# Patient Record
Sex: Female | Born: 1960
Health system: Southern US, Community
[De-identification: ages and names within clinical notes are randomized; demographics above are authoritative.]

## PROBLEM LIST (undated history)

## (undated) DIAGNOSIS — R079 Chest pain, unspecified: Secondary | ICD-10-CM

## (undated) HISTORY — DX: Chest pain, unspecified: R07.9

---

## 1999-02-21 ENCOUNTER — Ambulatory Visit (HOSPITAL_COMMUNITY): Admission: RE | Admit: 1999-02-21 | Discharge: 1999-02-21 | Payer: Self-pay | Admitting: *Deleted

## 1999-02-21 ENCOUNTER — Encounter: Payer: Self-pay | Admitting: *Deleted

## 1999-03-28 ENCOUNTER — Encounter: Payer: Self-pay | Admitting: Obstetrics and Gynecology

## 1999-03-28 ENCOUNTER — Ambulatory Visit (HOSPITAL_COMMUNITY): Admission: RE | Admit: 1999-03-28 | Discharge: 1999-03-28 | Payer: Self-pay | Admitting: *Deleted

## 1999-06-21 ENCOUNTER — Inpatient Hospital Stay (HOSPITAL_COMMUNITY): Admission: AD | Admit: 1999-06-21 | Discharge: 1999-06-24 | Payer: Self-pay | Admitting: Obstetrics and Gynecology

## 1999-06-21 ENCOUNTER — Encounter (INDEPENDENT_AMBULATORY_CARE_PROVIDER_SITE_OTHER): Payer: Self-pay

## 1999-06-27 ENCOUNTER — Encounter: Admission: RE | Admit: 1999-06-27 | Discharge: 1999-09-25 | Payer: Self-pay | Admitting: Obstetrics and Gynecology

## 1999-10-25 ENCOUNTER — Encounter (HOSPITAL_COMMUNITY): Admission: RE | Admit: 1999-10-25 | Discharge: 2000-01-23 | Payer: Self-pay | Admitting: Obstetrics and Gynecology

## 2000-01-25 ENCOUNTER — Encounter: Admission: RE | Admit: 2000-01-25 | Discharge: 2000-02-19 | Payer: Self-pay | Admitting: Obstetrics and Gynecology

## 2000-08-20 ENCOUNTER — Other Ambulatory Visit: Admission: RE | Admit: 2000-08-20 | Discharge: 2000-08-20 | Payer: Self-pay | Admitting: Obstetrics and Gynecology

## 2001-04-08 ENCOUNTER — Encounter: Payer: Self-pay | Admitting: *Deleted

## 2001-04-08 ENCOUNTER — Ambulatory Visit (HOSPITAL_COMMUNITY): Admission: RE | Admit: 2001-04-08 | Discharge: 2001-04-08 | Payer: Self-pay | Admitting: *Deleted

## 2001-08-24 ENCOUNTER — Inpatient Hospital Stay (HOSPITAL_COMMUNITY): Admission: AD | Admit: 2001-08-24 | Discharge: 2001-08-27 | Payer: Self-pay | Admitting: Obstetrics and Gynecology

## 2001-10-08 ENCOUNTER — Other Ambulatory Visit: Admission: RE | Admit: 2001-10-08 | Discharge: 2001-10-08 | Payer: Self-pay | Admitting: Obstetrics and Gynecology

## 2002-10-29 ENCOUNTER — Other Ambulatory Visit: Admission: RE | Admit: 2002-10-29 | Discharge: 2002-10-29 | Payer: Self-pay | Admitting: Obstetrics and Gynecology

## 2003-11-04 ENCOUNTER — Other Ambulatory Visit: Admission: RE | Admit: 2003-11-04 | Discharge: 2003-11-04 | Payer: Self-pay | Admitting: Obstetrics and Gynecology

## 2005-08-20 ENCOUNTER — Other Ambulatory Visit: Admission: RE | Admit: 2005-08-20 | Discharge: 2005-08-20 | Payer: Self-pay | Admitting: Obstetrics and Gynecology

## 2007-09-10 ENCOUNTER — Encounter: Admission: RE | Admit: 2007-09-10 | Discharge: 2007-09-10 | Payer: Self-pay | Admitting: Obstetrics and Gynecology

## 2009-09-21 ENCOUNTER — Encounter: Payer: Self-pay | Admitting: Cardiovascular Disease

## 2010-09-26 ENCOUNTER — Encounter: Payer: Self-pay | Admitting: Cardiovascular Disease

## 2010-10-06 NOTE — Discharge Summary (Signed)
[facility]  Patient:    Gina Garrett, BRIEL Visit Number: [id number] MRN: [id number]          Service Type: OBS Location: 910A [id number] [id number] Attending Physician:  Oliver Pila Dictated by:   Alvino Chapel, M.D. Admit Date:  08/24/2001 Discharge Date: 08/27/2001                             Discharge Summary  DISCHARGE DIAGNOSES: 1. Term pregnancy at 37+ weeks, delivered. 2. Advanced maternal age. 3. Status post normal spontaneous vaginal delivery.  DISCHARGE MEDICATIONS: 1. Motrin 600 mg p.o. q.6h. p.r.n. 2. Percocet one or two tablets p.o. q.4h. p.r.n.  FOLLOWUP:  The patient is to follow up in six weeks for her routine postpartum exam.  HISTORY OF PRESENT ILLNESS:  The patient is a 50 year old, G3, P0-1-1-1, who was admitted at 37+ weeks with a complaint of rupture of membranes and minimal contractions.  Pregnancy was complicated by advanced maternal age, and the patient declined amniocentesis.  The patient had a history of premature rupture of membranes at 34 weeks last pregnancy, and went on to deliver preterm.  PRENATAL LABORATORY DATA:  O+, antibody negative, RPR nonreactive, rubella immune.  Hepatitis B surface antigen negative.  HIV declined.  GC and chlamydia negative.  GBS negative.  PAST OBSTETRICAL HISTORY: 1. In 2001, she had a normal spontaneous vaginal delivery of a 5 pound 2 ounce    infant at 34 weeks. 2. In 1999, she had a spontaneous miscarriage.  PAST MEDICAL HISTORY:  None.  PAST GYNECOLOGICAL HISTORY:  None.  PAST SURGICAL HISTORY:  None.  PHYSICAL EXAMINATION:  VITAL SIGNS:  The patient was afebrile with stable vital signs.  Fetal heart rate was reactive.  PELVIC:  Fundal height was approximately 39 cm.  Estimated fetal weight was about 7 pounds.  Cervix was 70, 1, -2.  HOSPITAL COURSE:  Though the patient had grossly rupture membranes, she was having minimal contractions which were mild in nature,  and therefore was admitted with an order for Pitocin.  There was a delay of several hours in getting the patient admitted to labor and delivery, and upon reaching labor and delivery she was contracting every 3 minutes, and requesting pain medication.  She was then given Stadol several times for relief, and began to progress rather slowly throughout the evening and night.  Approximately 8 hours into the process the patient was very uncomfortable and requested an epidural.  At that point, she was completely effaced, 6+ cm dilated, and 0 to +1 station, and was making slow progress with dilation.  Each time Pitocin had been attempted, the patient had tetanic contractions with fetal deceleration. For that reason, after her epidural was placed, an IUPC was placed, and an amnio-infusion begun for some repetitive variable decelerations that was noted with the contractions.  With the IUCP in place it was noted that the patient was not adequate, and therefore she was restarted on Pitocin.  At this time the infant tolerated this well.  Eventually, the patient was adequate and despite some continued variable decelerations, overall the tracing was reassuring with accelerations and good scalp stimulation.  Several hours later the patient began to get uncomfortable, and despite multiple efforts of redosing her epidural she continued to be uncomfortable.  At that point, she was 9 cm, completely effaced, and a 0 station.  However, this was unreducible, and therefore the patient requested her epidural  be restarted.  This was replaced, and the patient was quite comfortable at that point.  She then reached complete dilation and pushed well with a normal spontaneous vaginal delivery of a viable female infant over an intact perineum.  There was a nuchal cord x1.  Weight was 6 pounds 14 ounces.  Apgars were 5 and 9. Placenta delivered spontaneously.  There was a mild shoulder dystocia which was relieved with  McRoberts maneuver.  Cervix, rectum, and vagina were intact. Estimated blood loss was 350 cc.  The patient was given a dose of ampicillin intrapartum, given a low grade temperature of 99 to 100, and given her prolonged ruptured status.  On postpartum day #2, the patient was afebrile, vital signs were stable.  She was felt stable for discharge home, and was discharged as stated previously with medications and followup in six weeks. Dictated by:   Alvino Chapel, M.D. Attending Physician:  Oliver Pila DD:  08/27/01 TD:  08/27/01 Job: [id number]

## 2010-10-06 NOTE — Discharge Summary (Signed)
[facility]  Patient:    Gina Garrett                        MRN: [id number] Adm. Date:  [id number] Disc. Date: [id number] Attending:  Michaele Offer                           Discharge Summary  DISCHARGE DIAGNOSES:          1. Intrauterine pregnancy at 34+ weeks, delivered.                               2. Premature rupture of membranes.                               3. Preterm labor.                               4. Advanced maternal age.                               5. Status post vacuum-assisted vaginal delivery.  DISCHARGE MEDICATIONS:        1. Percocet 1-2 tablets p.o. every four to six hours                                  as needed.                               2. Tylenol No. 3 1-2 tablets p.o. every four hours                                  p.r.n.                               3. Prenatal vitamins 1 p.o. daily.  FOLLOW-UP:                    The patient is to follow up in our office in approximately six weeks for her postpartum exam.  HOSPITAL COURSE:              The patient is a 50 year old G2, P0-0-1-0, who is  admitted at 34+ weeks with complaint of spontaneous rupture of membranes at approximately 1830 on June 21, 1999.  The patient had been having some contractions throughout the day, which increased after the rupture of membranes. She was noted to have good fetal movement and no vaginal bleeding.  Her prenatal care had been complicated by advanced maternal age, with a normal ultrasound. he patient declined amniocentesis.  PRENATAL LABORATORY DATA:     O-positive, antibody negative, rubella immune. Hepatitis B surface antigen negative.  HIV negative.  GC negative, chlamydia negative.  RPR negative.  PAST OBSTETRICAL HISTORY:     The patient had a miscarriage in February 1999.  PAST MEDICAL HISTORY:         None.  PAST SURGICAL HISTORY:        None.  MEDICATIONS:                  None.  ALLERGIES:                     No known drug allergies.  SOCIAL HISTORY:               The patient is married, and has no abuse of tobacco, drugs, or alcohol.  PHYSICAL EXAMINATION:         On admission the patient is afebrile with stable vital signs.  Fetal heart rate was reactive.  She had contractions every three o five minutes.  She was gravid, nontender.  Estimated fetal weight was approximately 5 pounds.  She was grossly ruptured with positive nitrazine, and was approximately 3 cm, 80% effaced, and -1 station.  HOSPITAL COURSE:              The patient was admitted with expected management. She continued to progress.  Had some Pitocin augmentation later in labor. Reached complete dilation, and pushed for about two hours, with the vertex brought to a +2 station and transverse lie.  As the patient had no epidural, she was given a pudendal and local block, and had a vacuum-assisted vaginal delivery of a viable female infant, Apgars were 8 and 9, weight was 5 pounds 2 ounces, and she had an intact perineum.  The placenta delivered spontaneously.  Dr. Alison Murray was in attendance from pediatrics to observe the baby.  Estimated blood loss was less han 500 cc.  The patient was then admitted for routine postpartum care, and did well. On postpartum day #2, she was afebrile with stable vital signs.  She had normal  lochia and no complaints.  Therefore, she was discharged to home with medications and follow-up as previously stated.            DD:  06/24/99 TD:  06/25/99 Job: [id number]

## 2010-10-06 NOTE — Op Note (Signed)
[facility]  Patient:    Gina Garrett                        MRN: [id number] Proc. Date: 06/22/99 Adm. Date:  [id number] Attending:  Michaele Offer                           Operative Report  DESCRIPTION OF PROCEDURE:  Patient reached completely dilated and pushed for approximately two hours without an epidural.  During this time the fetal heart tracing remained reassuring with some mild to moderate variable decelerations. She brought the vertex to a +2 station in a transverse position.  Due to the prolonged second stage, assisted delivery was discussed with the patient and her husband nd we all agreed to proceed.  A pudendal block was then administered with 1% lidocaine.  A local block was also performed at the perineum with 1% lidocaine.  Her bladder was then drained with a red rubber catheter of 100 cc of clear urine. As I did not feel the baby was very accessible to forceps, I applied the Mityvac vacuum cup with good application.  With three contractions, the vertex was brought easily to the perineum.  The NICU team was called and was present at the time of delivery with Dr. Alison Murray attending.  She delivered a viable female infant with Apgars of 8 and 9 that weighed 5 pounds, 2 ounces over an intact perineum.  There was  body cord which the baby delivered through.  The cord was then doubly clamped and cut and the baby taken to the warmer for the neonatal team.  The placenta then delivered spontaneously, was intact and there was a 3-vessel cord.  She had a second degree vaginal laceration just to the left of midline which was repaired  with 3-0 Vicryl with local block.  Her cervix and rectum were intact. Estimated blood loss was less than 500 cc.  Mother and baby were recovering well at the end of the procedure. DD:  06/22/99 TD:  06/23/99 Job: [id number]

## 2012-05-26 ENCOUNTER — Emergency Department (HOSPITAL_COMMUNITY)
Admission: EM | Admit: 2012-05-26 | Discharge: 2012-05-26 | Disposition: A | Discharge status: Home / Self Care | Payer: BC Managed Care – PPO | Attending: Emergency Medicine | Admitting: Emergency Medicine

## 2012-05-26 ENCOUNTER — Emergency Department (HOSPITAL_COMMUNITY): Payer: BC Managed Care – PPO

## 2012-05-26 ENCOUNTER — Encounter (HOSPITAL_COMMUNITY): Payer: Self-pay | Admitting: Emergency Medicine

## 2012-05-26 DIAGNOSIS — IMO0002 Reserved for concepts with insufficient information to code with codable children: Secondary | ICD-10-CM | POA: Insufficient documentation

## 2012-05-26 DIAGNOSIS — S39012A Strain of muscle, fascia and tendon of lower back, initial encounter: Secondary | ICD-10-CM

## 2012-05-26 DIAGNOSIS — X58XXXA Exposure to other specified factors, initial encounter: Secondary | ICD-10-CM | POA: Insufficient documentation

## 2012-05-26 DIAGNOSIS — R209 Unspecified disturbances of skin sensation: Secondary | ICD-10-CM | POA: Insufficient documentation

## 2012-05-26 DIAGNOSIS — Y929 Unspecified place or not applicable: Secondary | ICD-10-CM | POA: Insufficient documentation

## 2012-05-26 DIAGNOSIS — R61 Generalized hyperhidrosis: Secondary | ICD-10-CM | POA: Insufficient documentation

## 2012-05-26 DIAGNOSIS — Z7982 Long term (current) use of aspirin: Secondary | ICD-10-CM | POA: Insufficient documentation

## 2012-05-26 DIAGNOSIS — Y939 Activity, unspecified: Secondary | ICD-10-CM | POA: Insufficient documentation

## 2012-05-26 LAB — BASIC METABOLIC PANEL
Calcium: 9.5 mg/dL (ref 8.4–10.5)
Creatinine, Ser: 0.7 mg/dL (ref 0.50–1.10)
GFR calc Af Amer: >90 mL/min (ref >90)
Sodium: 143 mEq/L (ref 135–145)

## 2012-05-26 LAB — POCT I-STAT TROPONIN I: Troponin i, poc: 0 ng/mL (ref 0.00–0.08)

## 2012-05-26 LAB — CBC
MCH: 29.7 pg (ref 26.0–34.0)
MCV: 90.3 fL (ref 78.0–100.0)
Platelets: 203 10*3/uL (ref 150–400)
RBC: 4.34 MIL/uL (ref 3.87–5.11)
RDW: 13.2 % (ref 11.5–15.5)
WBC: 4.1 10*3/uL (ref 4.0–10.5)

## 2012-05-26 MED ORDER — IBUPROFEN 800 MG PO TABS
800.0000 mg | ORAL_TABLET | Freq: Once | ORAL | Status: AC
  Administered 2012-05-26: 800 mg via ORAL
  Filled 2012-05-26: qty 1

## 2012-05-26 NOTE — ED Notes (Signed)
Pt c/o upper left sided back pain with pain and numbness down left arm x 2 days

## 2012-05-26 NOTE — ED Provider Notes (Signed)
Medical screening examination/treatment/procedure(s) were performed by non-physician practitioner and as supervising physician I was immediately available for consultation/collaboration.   Jermaine Neuharth L Saudia Smyser, MD 05/26/12 1758 

## 2012-05-26 NOTE — ED Provider Notes (Signed)
History     CSN: [id number]  Arrival date & time 05/26/12  0917   First MD Initiated Contact with Patient 05/26/12 1015      Chief Complaint  Patient presents with  . Back Pain  . Numbness    (Consider location/radiation/quality/duration/timing/severity/associated sxs/prior treatment) HPI Comments: 52 year old female with no significant past medical history presents to the emergency department complaining of left-sided back pain located near her scapula beginning yesterday. Pain yesterday was worse than today. She took 81 mg aspirin last night which may have relieved her pain. Describes the pain as a dull ache, radiating down her left arm with associated numbness and tingling currently rated 3/10. Denies heavy lifting or any physical activity out of the normal. When the back pain started admits to associated diaphoresis with it she'll. Denies nausea, vomiting, chest pain, shortness of breath, lightheadedness or dizziness. No family history of heart disease before the age of 69, however her mom did have a heart attack in her 29s. Patient is a nonsmoker.  Patient is a 52 y.o. female presenting with back pain. The history is provided by the patient.  Back Pain  Associated symptoms include numbness. Pertinent negatives include no chest pain and no weakness.    History reviewed. No pertinent past medical history.  History reviewed. No pertinent past surgical history.  History reviewed. No pertinent family history.  History  Substance Use Topics  . Smoking status: Never Smoker   . Smokeless tobacco: Not on file  . Alcohol Use: Yes     Comment: occ    OB History    Grav Para Term Preterm Abortions TAB SAB Ect Mult Living                  Review of Systems  Constitutional: Positive for chills and diaphoresis.  HENT: Negative for neck pain and neck stiffness.   Eyes: Negative for visual disturbance.  Respiratory: Negative for cough and shortness of breath.   Cardiovascular:  Negative for chest pain.  Gastrointestinal: Negative for nausea and vomiting.  Genitourinary: Negative.   Musculoskeletal: Positive for back pain.  Skin: Negative for pallor.  Neurological: Positive for numbness. Negative for weakness.  Psychiatric/Behavioral: Negative for confusion. The patient is not nervous/anxious.     Allergies  Review of patient's allergies indicates no known allergies.  Home Medications   Current Outpatient Rx  Name  Route  Sig  Dispense  Refill  . ASPIRIN EC 81 MG PO TBEC   Oral   Take 81 mg by mouth daily. For not feeling well.         Marland Kitchen ONE-DAILY MULTI VITAMINS PO TABS   Oral   Take 1 tablet by mouth daily.           BP 133/86  Pulse 75  Temp 98.3 F (36.8 C) (Oral)  Resp 18  SpO2 99%  Physical Exam  Nursing note and vitals reviewed. Constitutional: She is oriented to person, place, and time. Vital signs are normal. She appears well-developed and well-nourished. No distress.  HENT:  Head: Normocephalic and atraumatic.  Mouth/Throat: Oropharynx is clear and moist.  Eyes: Conjunctivae normal and EOM are normal. Pupils are equal, round, and reactive to light.  Neck: Normal range of motion. Neck supple.  Cardiovascular: Normal rate, regular rhythm, normal heart sounds and intact distal pulses.        No extremity edema.  Pulmonary/Chest: Effort normal and breath sounds normal.  Abdominal: Soft. Bowel sounds are normal. There is no  tenderness.  Musculoskeletal: Normal range of motion. She exhibits no edema.       Left shoulder: Normal.       Thoracic back: She exhibits tenderness. She exhibits normal range of motion and no bony tenderness.       Back:       Neck or shoulder movement does not exacerbate her pain.  Neurological: She is alert and oriented to person, place, and time.  Skin: Skin is warm and dry.  Psychiatric: She has a normal mood and affect. Her behavior is normal.    ED Course  Procedures (including critical care  time)   Labs Reviewed  CBC  BASIC METABOLIC PANEL  POCT I-STAT TROPONIN I   Date: 05/26/2012  Rate: 59  Rhythm: normal sinus rhythm  QRS Axis: normal  Intervals: normal  ST/T Wave abnormalities: normal  Conduction Disutrbances:none  Narrative Interpretation: normal EKG  Old EKG Reviewed: none available   Dg Chest 2 View  05/26/2012  *RADIOLOGY REPORT*  Clinical Data: Shortness of breath.  Left-sided arm pain and numbness.  CHEST - 2 VIEW  Comparison: No priors.  Findings: Lung volumes are normal.  No consolidative airspace disease.  No pleural effusions.  No pneumothorax.  No pulmonary nodule or mass noted.  Pulmonary vasculature and the cardiomediastinal silhouette are within normal limits.  Bilateral nipple shadows.  IMPRESSION: 1. No radiographic evidence of acute cardiopulmonary disease.   Original Report Authenticated By: Trudie Reed, M.D.      1. Back strain       MDM  52 y/o female with back pain radiating into her left arm. Tenderness near scapular area present. No focal neuro deficits on exam. Cardiac workup completed. EKG, CXR, labs unremarkable. I do not feel her back pain is cardiac related. Tenderness of scapular region gone after receiving ibuprofen. She is stable for discharge. She will f/u with her PCP later this week. Patient states understanding of plan and is agreeable. Return precautions discussed.        Trevor Mace, PA-C 05/26/12 1128

## 2013-10-20 ENCOUNTER — Encounter: Payer: Self-pay | Admitting: Cardiovascular Disease

## 2013-10-20 LAB — IFOBT (OCCULT BLOOD): IFOBT: NEGATIVE

## 2014-07-20 ENCOUNTER — Emergency Department (HOSPITAL_COMMUNITY)
Admission: EM | Admit: 2014-07-20 | Discharge: 2014-07-20 | Disposition: A | Discharge status: Home / Self Care | Payer: 59 | Source: Home / Self Care | Attending: Family Medicine | Admitting: Family Medicine

## 2014-07-20 ENCOUNTER — Encounter (HOSPITAL_COMMUNITY): Payer: Self-pay | Admitting: Emergency Medicine

## 2014-07-20 DIAGNOSIS — R42 Dizziness and giddiness: Secondary | ICD-10-CM

## 2014-07-20 DIAGNOSIS — H6502 Acute serous otitis media, left ear: Secondary | ICD-10-CM | POA: Diagnosis not present

## 2014-07-20 LAB — POCT URINALYSIS DIP (DEVICE)
BILIRUBIN URINE: NEGATIVE
GLUCOSE, UA: NEGATIVE mg/dL
KETONES UR: NEGATIVE mg/dL
NITRITE: NEGATIVE
Protein, ur: NEGATIVE mg/dL
Specific Gravity, Urine: 1.015 (ref 1.005–1.030)
Urobilinogen, UA: 0.2 mg/dL (ref 0.0–1.0)
pH: 7.5 (ref 5.0–8.0)

## 2014-07-20 LAB — POCT I-STAT, CHEM 8
BUN: 18 mg/dL (ref 6–23)
CHLORIDE: 104 mmol/L (ref 96–112)
CREATININE: 0.7 mg/dL (ref 0.50–1.10)
Calcium, Ion: 1.17 mmol/L (ref 1.12–1.23)
GLUCOSE: 97 mg/dL (ref 70–99)
HEMATOCRIT: 42 % (ref 36.0–46.0)
Hemoglobin: 14.3 g/dL (ref 12.0–15.0)
POTASSIUM: 3.9 mmol/L (ref 3.5–5.1)
Sodium: 141 mmol/L (ref 135–145)
TCO2: 23 mmol/L (ref 0–100)

## 2014-07-20 MED ORDER — FLUTICASONE PROPIONATE 50 MCG/ACT NA SUSP: 2.0000 | Freq: Two times a day (BID) | NASAL | Status: DC

## 2014-07-20 MED ORDER — PREDNISONE 10 MG PO TABS: ORAL_TABLET | ORAL | Status: DC

## 2014-07-20 MED ORDER — MECLIZINE HCL 25 MG PO TABS: 25.0000 mg | ORAL_TABLET | Freq: Four times a day (QID) | ORAL | Status: DC | PRN

## 2014-07-20 NOTE — ED Provider Notes (Signed)
CSN: [id number]     Arrival date & time 07/20/14  1127 History   First MD Initiated Contact with Patient 07/20/14 1316     Chief Complaint  Patient presents with  . Otalgia  . Nausea   (Consider location/radiation/quality/duration/timing/severity/associated sxs/prior Treatment) HPI       54 year old female presents complaining of left ear pain, dizziness, chills, nausea. This started Saturday and has gotten worse over the past 2 days. No vomiting, fever, diarrhea, sinus pain or pressure, or cough. She has not been able to eat or drink much because of this she feels that she is dehydrated.  History reviewed. No pertinent past medical history. History reviewed. No pertinent past surgical history. History reviewed. No pertinent family history. History  Substance Use Topics  . Smoking status: Never Smoker   . Smokeless tobacco: Not on file  . Alcohol Use: Yes     Comment: occ   OB History    No data available     Review of Systems  Constitutional: Positive for chills. Negative for fever.  HENT: Positive for ear pain. Negative for postnasal drip and sore throat.   Respiratory: Negative for cough.   Neurological: Positive for dizziness.  All other systems reviewed and are negative.   Allergies  Review of patient's allergies indicates no known allergies.  Home Medications   Prior to Admission medications   Medication Sig Start Date End Date Taking? Authorizing Provider  aspirin EC 81 MG tablet Take 81 mg by mouth daily. For not feeling well.   Yes Historical Provider, MD  fluticasone (FLONASE) 50 MCG/ACT nasal spray Place 2 sprays into both nostrils 2 (two) times daily. Decrease to 2 sprays/nostril daily after 5 days 07/20/14   Graylon GoodZachary H Riot Waterworth, PA-C  meclizine (ANTIVERT) 25 MG tablet Take 1-2 tablets (25-50 mg total) by mouth every 6 (six) hours as needed for dizziness. 07/20/14   Graylon GoodZachary H Torrie Namba, PA-C  Multiple Vitamin (MULTIVITAMIN) tablet Take 1 tablet by mouth daily.    Historical  Provider, MD  predniSONE (DELTASONE) 10 MG tablet 4 tabs PO QD for 4 days; 3 tabs PO QD for 3 days; 2 tabs PO QD for 2 days; 1 tab PO QD for 1 day 07/20/14   Graylon GoodZachary H Ahnesti Townsend, PA-C   BP 134/72 mmHg  Pulse 60  Temp(Src) 98.4 F (36.9 C) (Oral)  Resp 16  SpO2 100% Physical Exam  Constitutional: She is oriented to person, place, and time. Vital signs are normal. She appears well-developed and well-nourished. No distress.  HENT:  Head: Normocephalic and atraumatic.  Right Ear: Tympanic membrane, external ear and ear canal normal.  Left Ear: External ear and ear canal normal. Tympanic membrane is bulging (tympanic membrane is bulging with a serous effusion and air-fluid levels).  Nose: Right sinus exhibits no maxillary sinus tenderness and no frontal sinus tenderness. Left sinus exhibits no maxillary sinus tenderness and no frontal sinus tenderness.  Mouth/Throat: Oropharynx is clear and moist.  Cardiovascular: Normal rate, regular rhythm and normal heart sounds.   Pulmonary/Chest: Effort normal and breath sounds normal. No respiratory distress.  Neurological: She is alert and oriented to person, place, and time. She has normal strength. Coordination normal.  Skin: Skin is warm and dry. No rash noted. She is not diaphoretic.  Psychiatric: She has a normal mood and affect. Judgment normal.  Nursing note and vitals reviewed.   ED Course  Procedures (including critical care time) Labs Review Labs Reviewed  POCT URINALYSIS DIP (DEVICE) - Abnormal; Notable  for the following:    Hgb urine dipstick TRACE (*)    Leukocytes, UA SMALL (*)    All other components within normal limits  POCT I-STAT, CHEM 8    Imaging Review No results found.   MDM   1. Acute serous otitis media of left ear without rupture   2. Vertigo    Treat with meclizine, Flonase, prednisone. Follow-up when necessary if worsening. No red flags at this time.   Meds ordered this encounter  Medications  . meclizine  (ANTIVERT) 25 MG tablet    Sig: Take 1-2 tablets (25-50 mg total) by mouth every 6 (six) hours as needed for dizziness.    Dispense:  20 tablet    Refill:  0  . predniSONE (DELTASONE) 10 MG tablet    Sig: 4 tabs PO QD for 4 days; 3 tabs PO QD for 3 days; 2 tabs PO QD for 2 days; 1 tab PO QD for 1 day    Dispense:  30 tablet    Refill:  0  . fluticasone (FLONASE) 50 MCG/ACT nasal spray    Sig: Place 2 sprays into both nostrils 2 (two) times daily. Decrease to 2 sprays/nostril daily after 5 days    Dispense:  16 g    Refill:  2       Graylon Good, PA-C 07/20/14 1356

## 2014-07-20 NOTE — ED Notes (Signed)
C/o  Left ear pain.  Neck soreness.  Dizzy.  Chills.  Nausea comes and goes.  Loose stools.  Symptoms present since Saturday.  No relief with rest or aspirin.

## 2014-07-20 NOTE — Discharge Instructions (Signed)
Benign Positional Vertigo °Vertigo means you feel like you or your surroundings are moving when they are not. Benign positional vertigo is the most common form of vertigo. Benign means that the cause of your condition is not serious. Benign positional vertigo is more common in older adults. °CAUSES  °Benign positional vertigo is the result of an upset in the labyrinth system. This is an area in the middle ear that helps control your balance. This may be caused by a viral infection, head injury, or repetitive motion. However, often no specific cause is found. °SYMPTOMS  °Symptoms of benign positional vertigo occur when you move your head or eyes in different directions. Some of the symptoms may include: °· Loss of balance and falls. °· Vomiting. °· Blurred vision. °· Dizziness. °· Nausea. °· Involuntary eye movements (nystagmus). °DIAGNOSIS  °Benign positional vertigo is usually diagnosed by physical exam. If the specific cause of your benign positional vertigo is unknown, your caregiver may perform imaging tests, such as magnetic resonance imaging (MRI) or computed tomography (CT). °TREATMENT  °Your caregiver may recommend movements or procedures to correct the benign positional vertigo. Medicines such as meclizine, benzodiazepines, and medicines for nausea may be used to treat your symptoms. In rare cases, if your symptoms are caused by certain conditions that affect the inner ear, you may need surgery. °HOME CARE INSTRUCTIONS  °· Follow your caregiver's instructions. °· Move slowly. Do not make sudden body or head movements. °· Avoid driving. °· Avoid operating heavy machinery. °· Avoid performing any tasks that would be dangerous to you or others during a vertigo episode. °· Drink enough fluids to keep your urine clear or pale yellow. °SEEK IMMEDIATE MEDICAL CARE IF:  °· You develop problems with walking, weakness, numbness, or using your arms, hands, or legs. °· You have difficulty speaking. °· You develop  severe headaches. °· Your nausea or vomiting continues or gets worse. °· You develop visual changes. °· Your family or friends notice any behavioral changes. °· Your condition gets worse. °· You have a fever. °· You develop a stiff neck or sensitivity to light. °MAKE SURE YOU:  °· Understand these instructions. °· Will watch your condition. °· Will get help right away if you are not doing well or get worse. °Document Released: 02/12/2006 Document Revised: 07/30/2011 Document Reviewed: 01/25/2011 °ExitCare® Patient Information ©2015 [employer]. This information is not intended to replace advice given to you by your health care provider. Make sure you discuss any questions you have with your health care provider. °Serous Otitis Media °Serous otitis media is fluid in the middle ear space. This space contains the bones for hearing and air. Air in the middle ear space helps to transmit sound.  °The air gets there through the eustachian tube. This tube goes from the back of the nose (nasopharynx) to the middle ear space. It keeps the pressure in the middle ear the same as the outside world. It also helps to drain fluid from the middle ear space. °CAUSES  °Serous otitis media occurs when the eustachian tube gets blocked. Blockage can come from: °· Ear infections. °· Colds and other upper respiratory infections. °· Allergies. °· Irritants such as cigarette smoke. °· Sudden changes in air pressure (such as descending in an airplane). °· Enlarged adenoids. °· A mass in the nasopharynx. °During colds and upper respiratory infections, the middle ear space can become temporarily filled with fluid. This can happen after an ear infection also. Once the infection clears, the fluid will   generally drain out of the ear through the eustachian tube. If it does not, then serous otitis media occurs. °SIGNS AND SYMPTOMS  °· Hearing loss. °· A feeling of fullness in the ear, without pain. °· Young children may not show any symptoms but  may show slight behavioral changes, such as agitation, ear pulling, or crying. °DIAGNOSIS  °Serous otitis media is diagnosed by an ear exam. Tests may be done to check on the movement of the eardrum. Hearing exams may also be done. °TREATMENT  °The fluid most often goes away without treatment. If allergy is the cause, allergy treatment may be helpful. Fluid that persists for several months may require minor surgery. A small tube is placed in the eardrum to: °· Drain the fluid. °· Restore the air in the middle ear space. °In certain situations, antibiotic medicines are used to avoid surgery. Surgery may be done to remove enlarged adenoids (if this is the cause). °HOME CARE INSTRUCTIONS  °· Keep children away from tobacco smoke. °· Keep all follow-up visits as directed by your health care provider. °SEEK MEDICAL CARE IF:  °· Your hearing is not better in 3 months. °· Your hearing is worse. °· You have ear pain. °· You have drainage from the ear. °· You have dizziness. °· You have serous otitis media only in one ear or have any bleeding from your nose (epistaxis). °· You notice a lump on your neck. °MAKE SURE YOU: °· Understand these instructions.   °· Will watch your condition.   °· Will get help right away if you are not doing well or get worse.   °Document Released: 07/28/2003 Document Revised: 09/21/2013 Document Reviewed: 12/02/2012 °ExitCare® Patient Information ©2015 [employer]. This information is not intended to replace advice given to you by your health care provider. Make sure you discuss any questions you have with your health care provider. ° °

## 2014-11-09 ENCOUNTER — Encounter: Payer: Self-pay | Admitting: Cardiovascular Disease

## 2015-09-08 ENCOUNTER — Ambulatory Visit (INDEPENDENT_AMBULATORY_CARE_PROVIDER_SITE_OTHER): Payer: 59 | Admitting: Cardiovascular Disease

## 2015-09-08 ENCOUNTER — Encounter: Payer: Self-pay | Admitting: Cardiovascular Disease

## 2015-09-08 VITALS — BP 104/68 | HR 60 | Ht 63.0 in | Wt 135.8 lb

## 2015-09-08 DIAGNOSIS — R079 Chest pain, unspecified: Secondary | ICD-10-CM

## 2015-09-08 DIAGNOSIS — R0789 Other chest pain: Secondary | ICD-10-CM | POA: Diagnosis not present

## 2015-09-08 NOTE — Progress Notes (Signed)
Cardiology Office Note   Date:  09/08/2015   ID:  Gina AskewKelley A Tamez, DOB 1960-11-21, MRN [id number]  PCP:  Lupe Carneyean Mitchell, MD  Cardiologist:   Elease HashimotoNahser, Deloris PingPhilip J, MD   Chief Complaint  Patient presents with  . Chest Pain   Problem List 1. Chest pain    History of Present Illness: Gina Garrett is a 55 y.o. female who presents for chest pain  Across her chest  Left sided, above her breast , radiates down her arm At rest, not necessarily with exercise Last for a few minutes.  Occurs several times a week.  Not pleuritic.   Not associated with eating or drinking  Exercises / walks 3 times week for 30 minutes Able to to do this without chest pain  She has no past medical problems   No past medical history on file.  No past surgical history on file.   Current Outpatient Prescriptions  Medication Sig Dispense Refill  . Magnesium 200 MG TABS Take 1 tablet by mouth daily.    . Multiple Vitamins-Minerals (MULTIVITAMIN WITH MINERALS) tablet Take 1 tablet by mouth daily.    . Omega 3 1200 MG CAPS Take 1 capsule by mouth daily.    . Potassium 99 MG TABS Take 1 tablet by mouth daily.     No current facility-administered medications for this visit.    Allergies:   Review of patient's allergies indicates no known allergies.    Social History:  The patient  reports that she has never smoked. She does not have any smokeless tobacco history on file. She reports that she drinks alcohol. She reports that she does not use illicit drugs.   Family History:  The patient's family history includes Breast cancer in her mother; Diabetes in her mother; Heart attack (age of onset: 2362) in her mother.    ROS:  Please see the history of present illness.    Review of Systems: Constitutional:  denies fever, chills, diaphoresis, appetite change and fatigue.  Gets cold easily   HEENT: denies photophobia, eye pain, redness, hearing loss, ear pain, congestion, sore throat, rhinorrhea, sneezing, neck  pain, neck stiffness and tinnitus.  Respiratory: denies SOB, DOE, cough, chest tightness, and wheezing.  Cardiovascular: denies chest pain, palpitations and leg swelling.  Gastrointestinal: denies nausea, vomiting, abdominal pain, diarrhea, constipation, blood in stool.  Genitourinary: denies dysuria, urgency, frequency, hematuria, flank pain and difficulty urinating.  Musculoskeletal: denies  myalgias, back pain, joint swelling, arthralgias and gait problem.   Skin: denies pallor, rash and wound.  Neurological: denies dizziness, seizures, syncope, weakness, light-headedness, numbness and headaches.   Hematological: denies adenopathy, easy bruising, personal or family bleeding history.  Psychiatric/ Behavioral: denies suicidal ideation, mood changes, confusion, nervousness, sleep disturbance and agitation.       All other systems are reviewed and negative.    PHYSICAL EXAM: VS:  BP 104/68 mmHg  Pulse 60  Ht 5\' 3"  (1.6 m)  Wt 135 lb 12.8 oz (61.598 kg)  BMI 24.06 kg/m2 , BMI Body mass index is 24.06 kg/(m^2). GEN: Well nourished, well developed, in no acute distress HEENT: normal Neck: no JVD, carotid bruits, or masses Cardiac: RRR; no murmurs, rubs, or gallops,no edema  Respiratory:  clear to auscultation bilaterally, normal work of breathing GI: soft, nontender, nondistended, + BS MS: no deformity or atrophy Skin: warm and dry, no rash Neuro:  Strength and sensation are intact Psych: normal   EKG:  EKG is ordered today. The ekg ordered today  demonstrates  NSR at 60.  Normal ECG    Recent Labs: No results found for requested labs within last 365 days.    Lipid Panel No results found for: CHOL, TRIG, HDL, CHOLHDL, VLDL, LDLCALC, LDLDIRECT    Wt Readings from Last 3 Encounters:  09/08/15 135 lb 12.8 oz (61.598 kg)      Other studies Reviewed: Additional studies/ records that were reviewed today include: . Review of the above records demonstrates:   ASSESSMENT  AND PLAN:  1.  Chest pain:   I have reviewed records from Dr. Diamantina [facility] office. The CP is very atypical , lipids are well controlled Her only real risk factor is that her mother had an MI at age 13. ( mother is obese)  She's been having some gastroesophageal reflux symptoms recently. I've advised her to try taking some omeprazole. I suspect that her chest pain is likely either musculoskeletal or gastrointestinal. I've also advised her to take some Motrin. I'll see her in 3 months. She'll let us know which one seemed to work better. She'll call me back sooner if her symptoms worsen and she wants to have a stress test.  She informed that she will he does not like taking medications. She'll take sodium bicarbonate and water instead of omeprazole and will use Tumeric  and other anti-inflammatories in place of Motrin. I think these should work well.   Current medicines are reviewed at length with the patient today.  The patient does not have concerns regarding medicines.  The following changes have been made:  no change  Labs/ tests ordered today include:  No orders of the defined types were placed in this encounter.     Disposition:   FU with  Me in 3 months      Ciji Boston, Deloris Ping, MD  09/08/2015 8:56 AM    [facility] [address] [place], [place], [place]  [address] Phone: [phone]; Fax: [phone]   [facility]  [address] [place], [place]  [address] [phone]   Fax [phone]

## 2015-09-08 NOTE — Patient Instructions (Addendum)
Medication Instructions:  Your physician recommends you continue current medications and does not recommend that you start any new medications.    Labwork: None Ordered   Testing/Procedures: None Ordered   Follow-Up: Your physician recommends that you schedule a follow-up appointment in: 3 months with Dr. Elease HashimotoNahser.    If you need a refill on your cardiac medications before your next appointment, please call your pharmacy.   Thank you for choosing [facility]! Eligha BridegroomMichelle Zubin Pontillo, RN [phone]

## 2015-11-09 ENCOUNTER — Encounter: Payer: Self-pay | Admitting: Cardiovascular Disease

## 2015-12-08 ENCOUNTER — Encounter: Payer: Self-pay | Admitting: Cardiovascular Disease

## 2015-12-08 ENCOUNTER — Ambulatory Visit (INDEPENDENT_AMBULATORY_CARE_PROVIDER_SITE_OTHER): Payer: 59 | Admitting: Cardiovascular Disease

## 2015-12-08 VITALS — BP 98/62 | HR 64 | Ht 63.0 in | Wt 140.8 lb

## 2015-12-08 DIAGNOSIS — R0789 Other chest pain: Secondary | ICD-10-CM

## 2015-12-08 NOTE — Patient Instructions (Addendum)
Medication Instructions:  Your physician recommends that you continue on your current medications as directed. Please refer to the Current Medication list given to you today.   Labwork: None Ordered   Testing/Procedures: Your physician has requested that you have an exercise tolerance test. For further information please visit [email or web address] Please also follow instruction sheet, as given.    Follow-Up: Your physician wants you to follow-up in: 6 months with Dr. Elease HashimotoNahser.  You will receive a reminder letter in the mail two months in advance. If you don't receive a letter, please call our office to schedule the follow-up appointment.   If you need a refill on your cardiac medications before your next appointment, please call your pharmacy.   Thank you for choosing [facility]! Eligha BridegroomMichelle Noraa Pickeral, RN [phone]

## 2015-12-08 NOTE — Progress Notes (Signed)
Cardiology Office Note   Date:  12/08/2015   ID:  Gina Garrett, DOB Jun 17, 1960, MRN [id number]  PCP:  Lupe Carney, MD  Cardiologist:   Kristeen Miss, MD   Chief Complaint  Patient presents with  . Follow-up    chest pain    Problem List 1. Chest pain    History of Present Illness: Gina Garrett is a 55 y.o. female who presents for chest pain  Across her chest  Left sided, above her breast , radiates down her arm At rest, not necessarily with exercise Last for a few minutes.  Occurs several times a week.  Not pleuritic.   Not associated with eating or drinking  Exercises / walks 3 times week for 30 minutes Able to to do this without chest pain  She has no past medical problems   December 08, 2015:  Still having some CP , Better with exercise  Pains are hit and miss,  Usually when sitting .Marland Kitchen    History reviewed. No pertinent past medical history.  History reviewed. No pertinent past surgical history.   Current Outpatient Prescriptions  Medication Sig Dispense Refill  . Coenzyme Q10 (CO Q 10 PO) Take 1 tablet by mouth daily.    . Magnesium 200 MG TABS Take 1 tablet by mouth daily.    . Multiple Vitamins-Minerals (MULTIVITAMIN WITH MINERALS) tablet Take 1 tablet by mouth daily.    . Omega 3 1200 MG CAPS Take 1 capsule by mouth daily.    . Potassium 99 MG TABS Take 1 tablet by mouth daily.    . Pyridoxine HCl (VITAMIN B-6 PO) Take 1 tablet by mouth daily.     No current facility-administered medications for this visit.    Allergies:   Review of patient's allergies indicates no known allergies.    Social History:  The patient  reports that she has never smoked. She does not have any smokeless tobacco history on file. She reports that she drinks alcohol. She reports that she does not use illicit drugs.   Family History:  The patient's family history includes Breast cancer in her mother; Diabetes in her mother; Heart attack (age of onset: 29) in her mother.     ROS:  Please see the history of present illness.    Review of Systems: Constitutional:  denies fever, chills, diaphoresis, appetite change and fatigue.  Gets cold easily   HEENT: denies photophobia, eye pain, redness, hearing loss, ear pain, congestion, sore throat, rhinorrhea, sneezing, neck pain, neck stiffness and tinnitus.  Respiratory: denies SOB, DOE, cough, chest tightness, and wheezing.  Cardiovascular: denies chest pain, palpitations and leg swelling.  Gastrointestinal: denies nausea, vomiting, abdominal pain, diarrhea, constipation, blood in stool.  Genitourinary: denies dysuria, urgency, frequency, hematuria, flank pain and difficulty urinating.  Musculoskeletal: denies  myalgias, back pain, joint swelling, arthralgias and gait problem.   Skin: denies pallor, rash and wound.  Neurological: denies dizziness, seizures, syncope, weakness, light-headedness, numbness and headaches.   Hematological: denies adenopathy, easy bruising, personal or family bleeding history.  Psychiatric/ Behavioral: denies suicidal ideation, mood changes, confusion, nervousness, sleep disturbance and agitation.       All other systems are reviewed and negative.    PHYSICAL EXAM: VS:  BP 98/62 mmHg  Pulse 64  Ht  (1.6 m)  Wt 140 lb 12.8 oz (63.866 kg)  BMI 24.95 kg/m2  SpO2 98% , BMI Body mass index is 24.95 kg/(m^2). GEN: Well nourished, well developed, in no acute distress  HEENT: normal Neck: no JVD, carotid bruits, or masses Cardiac: RRR; no murmurs, rubs, or gallops,no edema  Respiratory:  clear to auscultation bilaterally, normal work of breathing GI: soft, nontender, nondistended, + BS MS: no deformity or atrophy Skin: warm and dry, no rash Neuro:  Strength and sensation are intact Psych: normal   EKG:  EKG is not ordered today.       Recent Labs: No results found for requested labs within last 365 days.    Lipid Panel No results found for: CHOL, TRIG, HDL, CHOLHDL,  VLDL, LDLCALC, LDLDIRECT    Wt Readings from Last 3 Encounters:  12/08/15 140 lb 12.8 oz (63.866 kg)  09/08/15 135 lb 12.8 oz (61.598 kg)      Other studies Reviewed: Additional studies/ records that were reviewed today include: . Review of the above records demonstrates:   ASSESSMENT AND PLAN:  1.  Chest pain:   Very atypical  Last a few seconds.  Always on the left side  Not associated with deep breaths Not related to exertion .   In fact, exercise seems to help  Will have her return for a GXT.   Return in 6 months     Current medicines are reviewed at length with the patient today.  The patient does not have concerns regarding medicines.  The following changes have been made:  no change  Labs/ tests ordered today include:  No orders of the defined types were placed in this encounter.     Disposition:   FU with  Me in 6 months      Kristeen MissPhilip Jesse Nosbisch, MD  12/08/2015 8:12 AM    [facility] [address], [place], [place]  [address] Phone: [phone]; Fax: [phone]   [facility]  [address] [place], [place]  [address] [phone]   Fax [phone]

## 2015-12-30 ENCOUNTER — Ambulatory Visit (INDEPENDENT_AMBULATORY_CARE_PROVIDER_SITE_OTHER): Payer: 59

## 2015-12-30 DIAGNOSIS — R0789 Other chest pain: Secondary | ICD-10-CM | POA: Diagnosis not present

## 2015-12-30 LAB — EXERCISE TOLERANCE TEST
CHL CUP MPHR: 163 {beats}/min
CHL CUP RESTING HR STRESS: 59 {beats}/min
CHL RATE OF PERCEIVED EXERTION: 16
CSEPED: 7 min
Estimated workload: 8.5 METS
Exercise duration (sec): 1 s
Peak HR: 157 {beats}/min
Percent HR: 96 %

## 2018-10-15 ENCOUNTER — Telehealth: Payer: Self-pay

## 2018-10-15 NOTE — Telephone Encounter (Signed)
Virtual Visit Pre-Appointment Phone Call  "(Name), I am calling you today to discuss your upcoming appointment. We are currently trying to limit exposure to the virus that causes COVID-19 by seeing patients at home rather than in the office."  1. "What is the BEST phone number to call the day of the visit?" - include this in appointment notes  2. "Do you have or have access to (through a family member/friend) a smartphone with video capability that we can use for your visit?" a. If yes - list this number in appt notes as "cell" (if different from BEST phone #) and list the appointment type as a VIDEO visit in appointment notes b. If no - list the appointment type as a PHONE visit in appointment notes  Confirm consent - "In the setting of the current Covid19 crisis, you are scheduled for a (phone or video) visit with your provider on (date) at (time).  Just as we do with many in-office visits, in order for you to participate in this visit, we must obtain consent.  If you'd like, I can send this to your mychart (if signed up) or email for you to review.  Otherwise, I can obtain your verbal consent now.  All virtual visits are billed to your insurance company just like a normal visit would be.  By agreeing to a virtual visit, we'd like you to understand that the technology does not allow for your provider to perform an examination, and thus may limit your provider's ability to fully assess your condition. If your provider identifies any concerns that need to be evaluated in person, we will make arrangements to do so.  Finally, though the technology is pretty good, we cannot assure that it will always work on either your or our end, and in the setting of a video visit, we may have to convert it to a phone-only visit.  In either situation, we cannot ensure that we have a secure connection.  Are you willing to proceed?"  YES 3. Advise patient to be prepared - "Two hours prior to your appointment, go ahead  and check your blood pressure, pulse, oxygen saturation, and your weight (if you have the equipment to check those) and write them all down. When your visit starts, your provider will ask you for this information. If you have an Apple Watch or Kardia device, please plan to have heart rate information ready on the day of your appointment. Please have a pen and paper handy nearby the day of the visit as well."  4. Give patient instructions for MyChart download to smartphone OR Doximity/Doxy.me as below if video visit (depending on what platform provider is using)  5. Inform patient they will receive a phone call 15 minutes prior to their appointment time (may be from unknown caller ID) so they should be prepared to answer    TELEPHONE CALL NOTE  Gina Garrett has been deemed a candidate for a follow-up tele-health visit to limit community exposure during the Covid-19 pandemic. I spoke with the patient via phone to ensure availability of phone/video source, confirm preferred email & phone number, and discuss instructions and expectations.  I reminded Gina Garrett to be prepared with any vital sign and/or heart rhythm information that could potentially be obtained via home monitoring, at the time of his visit. I reminded Gina Garrett to expect a phone call prior to his visit.  [name], [facility] 10/15/2018 4:06 PM   INSTRUCTIONS FOR  DOWNLOADING THE MYCHART APP TO SMARTPHONE  - The patient must first make sure to have activated MyChart and know their login information - If Apple, go to [employer] and type in MyChart in the search bar and download the app. If Android, ask patient to go to [employer] and type in [place] in the search bar and download the app. The app is free but as with any other app downloads, their phone may require them to verify saved payment information or Apple/Android password.  - The patient will need to then log into the app with their MyChart username and  password, and select [facility] as their healthcare provider to link the account. When it is time for your visit, go to the MyChart app, find appointments, and click Begin Video Visit. Be sure to Select Allow for your device to access the Microphone and Camera for your visit. You will then be connected, and your provider will be with you shortly.  **If they have any issues connecting, or need assistance please contact MyChart service desk [phone])**  **If using a computer, in order to ensure the best quality for their visit they will need to use either of the following Internet Browsers: [employer], or Google Chrome**  IF USING DOXIMITY or DOXY.ME - The patient will receive a link just prior to their visit by text.     FULL LENGTH CONSENT FOR TELE-HEALTH VISIT   I hereby voluntarily request, consent and authorize [facility] and its employed or contracted physicians, physician assistants, nurse practitioners or other licensed health care professionals (the Practitioner), to provide me with telemedicine health care services (the "Services") as deemed necessary by the treating Practitioner. I acknowledge and consent to receive the Services by the Practitioner via telemedicine. I understand that the telemedicine visit will involve communicating with the Practitioner through live audiovisual communication technology and the disclosure of certain medical information by electronic transmission. I acknowledge that I have been given the opportunity to request an in-person assessment or other available alternative prior to the telemedicine visit and am voluntarily participating in the telemedicine visit.  I understand that I have the right to withhold or withdraw my consent to the use of telemedicine in the course of my care at any time, without affecting my right to future care or treatment, and that the Practitioner or I may terminate the telemedicine visit at any time. I  understand that I have the right to inspect all information obtained and/or recorded in the course of the telemedicine visit and may receive copies of available information for a reasonable fee.  I understand that some of the potential risks of receiving the Services via telemedicine include:  Marland Kitchen Delay or interruption in medical evaluation due to technological equipment failure or disruption; . Information transmitted may not be sufficient (e.g. poor resolution of images) to allow for appropriate medical decision making by the Practitioner; and/or  . In rare instances, security protocols could fail, causing a breach of personal health information.  Furthermore, I acknowledge that it is my responsibility to provide information about my medical history, conditions and care that is complete and accurate to the best of my ability. I acknowledge that Practitioner's advice, recommendations, and/or decision may be based on factors not within their control, such as incomplete or inaccurate data provided by me or distortions of diagnostic images or specimens that may result from electronic transmissions. I understand that the practice of medicine is not an exact science and that  Practitioner makes no warranties or guarantees regarding treatment outcomes. I acknowledge that I will receive a copy of this consent concurrently upon execution via email to the email address I last provided but may also request a printed copy by calling the office of [place].    I understand that my insurance will be billed for this visit.   I have read or had this consent read to me. . I understand the contents of this consent, which adequately explains the benefits and risks of the Services being provided via telemedicine.  . I have been provided ample opportunity to ask questions regarding this consent and the Services and have had my questions answered to my satisfaction. . I give my informed consent for the services to be  provided through the use of telemedicine in my medical care  By participating in this telemedicine visit I agree to the above.

## 2018-10-17 ENCOUNTER — Telehealth (INDEPENDENT_AMBULATORY_CARE_PROVIDER_SITE_OTHER): Payer: No Typology Code available for payment source | Admitting: Cardiology

## 2018-10-17 ENCOUNTER — Other Ambulatory Visit: Payer: Self-pay

## 2018-10-17 ENCOUNTER — Encounter: Payer: Self-pay | Admitting: Cardiology

## 2018-10-17 VITALS — BP 125/68 | HR 74 | Ht 63.0 in | Wt 133.6 lb

## 2018-10-17 DIAGNOSIS — R0789 Other chest pain: Secondary | ICD-10-CM | POA: Diagnosis not present

## 2018-10-17 NOTE — Patient Instructions (Signed)
Medication Instructions:  Your physician recommends that you continue on your current medications as directed. Please refer to the Current Medication list given to you today.  If you need a refill on your cardiac medications before your next appointment, please call your pharmacy.   Lab work: None ordered If you have labs (blood work) drawn today and your tests are completely normal, you will receive your results only by: . MyChart Message (if you have MyChart) OR . A paper copy in the mail If you have any lab test that is abnormal or we need to change your treatment, we will call you to review the results.  Testing/Procedures: None ordered  Follow-Up: At [facility], you and your health needs are our priority.  As part of our continuing mission to provide you with exceptional heart care, we have created designated Provider Care Teams.  These Care Teams include your primary Cardiologist (physician) and Advanced Practice Providers (APPs -  Physician Assistants and Nurse Practitioners) who all work together to provide you with the care you need, when you need it. You will need a follow up appointment in:  12 months.  Please call our office 2 months in advance to schedule this appointment.  You may see Dr. Nahser or one of the following Advanced Practice Providers on your designated Care Team: Scott Weaver, PA-C Vin Bhagat, PA-C . Janine Hammond, NP  Any Other Special Instructions Will Be Listed Below (If Applicable).    

## 2018-10-17 NOTE — Progress Notes (Signed)
Virtual Visit via telephone Note   This visit type was conducted due to national recommendations for restrictions regarding the COVID-19 Pandemic (e.g. social distancing) in an effort to limit this patient's exposure and mitigate transmission in our community.  Due to her co-morbid illnesses, this patient is at least at moderate risk for complications without adequate follow up.  This format is felt to be most appropriate for this patient at this time.  All issues noted in this document were discussed and addressed.  A limited physical exam was performed with this format.  Please refer to the patient's chart for her consent to telehealth for [facility].   Date:  10/17/2018   ID:  Gina Garrett, DOB May 10, 1961, MRN [id number]  Patient Location: Home Provider Location: Home  PCP:  Clovis Riley, Elbert Ewings.August Saucer, MD  Cardiologist: Dr. Elease Hashimoto, MD   Evaluation Performed:  Follow-Up Visit  Chief Complaint:  Follow up for chest pain, seen for Dr. Elease Hashimoto  History of Present Illness:    IZEL HOCHBERG is a 58 y.o. female with a prior hx of chest pain and no other medical concerns.  She was last seen by Dr. Elease Hashimoto on 12/08/2015 for follow-up of chest pain.  At that time she continued to have intermittent chest pain which was improved with exercise.  Pains were atypical for ACS.  At that time, she reported left-sided discomfort which would last for a few seconds in duration improved with exercise.  Given her symptoms, she was scheduled for a exercise stress test which was performed 12/30/2015 that showed fair exercise tolerance with no chest pain and normal BP response. There was no ST changes and considered a negative adequate ETT.  Today Ms. Witherell continues to have left scapular pain with significant muscle tension which radiates to her left breast and anterior chest.  She reports that she works at a [employer] and is typically always turning to the left side.  She gets regular massages about every 3 weeks  for her muscle tension which helps however, the muscle tenderness returns at about the 3 to 4-week mark.  She states as above, that exercise helps this.  She denies diaphoresis, nausea or vomiting.  She has no shortness of breath or other exertional features. She continues to exercise regularly without anginal symptoms.  She is seen a physical therapist in the past in which she was prescribed specific exercises which have also helped.  Given COVID-19 some of these remedies and appointments have fallen off and she is noticed some mild increase in her symptoms.  She states that she will take a Tylenol intermittently which help as well as other natural remedies.  She is not interested in pursuing muscle relaxant or other pain medications.  She has an upcoming appointment with her PCP who is now Dr. Clelia Croft for physical with labs.  The patient does not have symptoms concerning for COVID-19 infection (fever, chills, cough, or new shortness of breath).   Past Medical History:  Diagnosis Date  . Chest pain    No past surgical history on file.   Current Meds  Medication Sig  . Coenzyme Q10 (CO Q 10 PO) Take 1 tablet by mouth daily.  . Magnesium 200 MG TABS Take 1 tablet by mouth daily.  . Multiple Vitamins-Minerals (MULTIVITAMIN WITH MINERALS) tablet Take 1 tablet by mouth daily.  . Omega 3 1200 MG CAPS Take 1 capsule by mouth daily.  . Potassium 99 MG TABS Take 1 tablet by mouth daily.  Marland Kitchen  Pyridoxine HCl (VITAMIN B-6 PO) Take 1 tablet by mouth daily.    Allergies:   Patient has no known allergies.   Social History   Tobacco Use  . Smoking status: Never Smoker  . Smokeless tobacco: Never Used  Substance Use Topics  . Alcohol use: Yes    Comment: occ  . Drug use: No    Family Hx: The patient's family history includes Breast cancer in her mother; Diabetes in her mother; Heart attack (age of onset: 34) in her mother.  ROS:   Please see the history of present illness.     All other systems  reviewed and are negative.  Prior CV studies:   The following studies were reviewed today:  Exercise stress test 12/30/2015:  Blood pressure demonstrated a normal response to exercise.  There was no ST segment deviation noted during stress.   ETT with fair exercise tolerance (7:01); no chest pain; normal BP response; no ST changes; negative adequate ETT.  Labs/Other Tests and Data Reviewed:    EKG:  An ECG dated 09/08/2015 was personally reviewed today and demonstrated:  NSR  Recent Labs: No results found for requested labs within last 8760 hours.   Recent Lipid Panel No results found for: CHOL, TRIG, HDL, CHOLHDL, LDLCALC, LDLDIRECT  Wt Readings from Last 3 Encounters:  10/17/18 133 lb 9.6 oz (60.6 kg)  12/08/15 140 lb 12.8 oz (63.9 kg)  09/08/15 135 lb 12.8 oz (61.6 kg)    Objective:    Vital Signs:  BP 125/68   Pulse 74   Ht 5\' 3"  (1.6 m)   Wt 133 lb 9.6 oz (60.6 kg)   BMI 23.67 kg/m    VITAL SIGNS:  reviewed GEN:  no acute distress RESPIRATORY:  normal respiratory effort NEURO:  alert and oriented x 3 PSYCH:  normal affect  ASSESSMENT & PLAN:    1.  History of chest/left scapular pain: -Continues to have left scapular pain described as a muscle tension which will radiate to her left breast, not associated with exertion or other associated symptoms such as diaphoresis, nausea or vomiting -Very atypical and appears to be musculoskeletal in nature -She reports much improvement after receiving massages which she get every 3 weeks to keep her symptoms controlled. She is also seen a physical therapist in which she has specific exercises which also help. -Continues to exercise regularly with aerobic, high endurance exercise with no anginal symptoms -No further cardiac work-up needed at this time as she underwent an exercise stress test in 2017 that was normal with no ischemia or EKG changes -We have scheduled her for follow-up in 1 year however she may be able to be seen  on a PRN basis if her symptoms do not change -Has annual follow-up with Dr. Clelia Croft, PCP for lab work but do not anticipate any significant abnormalities   COVID-19 Education: The signs and symptoms of COVID-19 were discussed with the patient and how to seek care for testing (follow up with PCP or arrange E-visit).The importance of social distancing was discussed today.  Time:   Today, I have spent 20 minutes with the patient with telehealth technology discussing the above problems.     Medication Adjustments/Labs and Tests Ordered: Current medicines are reviewed at length with the patient today.  Concerns regarding medicines are outlined above.   Tests Ordered: No orders of the defined types were placed in this encounter.   Medication Changes: No orders of the defined types were placed in this encounter.  Disposition:  Follow up Dr. Elease HashimotoNahser in 1 year or sooner if needed    Signed, Georgie ChardJill Bryanna Yim, NP  10/17/2018 2:57 PM    [facility]

## 2019-10-15 ENCOUNTER — Encounter: Payer: Self-pay | Admitting: Cardiovascular Disease

## 2019-10-15 ENCOUNTER — Ambulatory Visit (INDEPENDENT_AMBULATORY_CARE_PROVIDER_SITE_OTHER): Payer: No Typology Code available for payment source | Admitting: Cardiovascular Disease

## 2019-10-15 ENCOUNTER — Other Ambulatory Visit: Payer: Self-pay

## 2019-10-15 DIAGNOSIS — R079 Chest pain, unspecified: Secondary | ICD-10-CM

## 2019-10-15 DIAGNOSIS — E782 Mixed hyperlipidemia: Secondary | ICD-10-CM | POA: Diagnosis not present

## 2019-10-15 DIAGNOSIS — R0789 Other chest pain: Secondary | ICD-10-CM

## 2019-10-15 DIAGNOSIS — E785 Hyperlipidemia, unspecified: Secondary | ICD-10-CM | POA: Insufficient documentation

## 2019-10-15 MED ORDER — METOPROLOL TARTRATE 50 MG PO TABS: ORAL_TABLET | ORAL | 0 refills | Status: DC

## 2019-10-15 NOTE — Patient Instructions (Addendum)
Medication Instructions:  Your physician recommends that you continue on your current medications as directed. Please refer to the Current Medication list given to you today.  *If you need a refill on your cardiac medications before your next appointment, please call your pharmacy*   Lab Work: Your physician recommends that you return for lab work in: 1 week before your Coronary CT for basic metabolic panel  If you have labs (blood work) drawn today and your tests are completely normal, you will receive your results only by: Marland Kitchen MyChart Message (if you have MyChart) OR . A paper copy in the mail If you have any lab test that is abnormal or we need to change your treatment, we will call you to review the results.    Follow-Up: At [facility], you and your health needs are our priority.  As part of our continuing mission to provide you with exceptional heart care, we have created designated Provider Care Teams.  These Care Teams include your primary Cardiologist (physician) and Advanced Practice Providers (APPs -  Physician Assistants and Nurse Practitioners) who all work together to provide you with the care you need, when you need it.   Your next appointment:   1 year(s)  The format for your next appointment:   In Person  Provider:   You may see Mertie Moores, MD or one of the following Advanced Practice Providers on your designated Care Team:    Richardson Dopp, PA-C  Robbie Lis, [place]    Testing/Procedures: Your cardiac CT will be scheduled at one of the below locations:   [facility] [address] [place], [place] [address] [phone]  [place] [address] [place], [place] [address] [phone]  If scheduled at [facility], please arrive at the [facility] main entrance of [facility] 30 minutes prior to test start time. Proceed to the [facility] Radiology Department (first  floor) to check-in and test prep.  If scheduled at [facility], please arrive 15 mins early for check-in and test prep.  Please follow these instructions carefully (unless otherwise directed):   On the Night Before the Test: . Be sure to Drink plenty of water. . Do not consume any caffeinated/decaffeinated beverages or chocolate 12 hours prior to your test. . Do not take any antihistamines 12 hours prior to your test. . If the patient has contrast allergy: ? Patient will need a prescription for Prednisone and very clear instructions (as follows): 1. Prednisone 50 mg - take 13 hours prior to test 2. Take another Prednisone 50 mg 7 hours prior to test 3. Take another Prednisone 50 mg 1 hour prior to test 4. Take Benadryl 50 mg 1 hour prior to test . Patient must complete all four doses of above prophylactic medications. . Patient will need a ride after test due to Benadryl.  On the Day of the Test: . Drink plenty of water. Do not drink any water within one hour of the test. . Do not eat any food 4 hours prior to the test. . You may take your regular medications prior to the test.  . Take metoprolol (Lopressor) two hours prior to test. . FEMALES- please wear underwire-free bra if available        After the Test: . Drink plenty of water. . After receiving IV contrast, you may experience a mild flushed feeling. This is normal. . On occasion, you may experience  a mild rash up to 24 hours after the test. This is not dangerous. If this occurs, you can take Benadryl 25 mg and increase your fluid intake. . If you experience trouble breathing, this can be serious. If it is severe call 911 IMMEDIATELY. If it is mild, please call our office. . If you take any of these medications: Glipizide/Metformin, Avandament, Glucavance, please do not take 48 hours after completing test unless otherwise instructed.   Once we have confirmed authorization from your insurance  company, we will call you to set up a date and time for your test.   For non-scheduling related questions, please contact the cardiac imaging nurse navigator should you have any questions/concerns: Marchia Bond, Cardiac Imaging Nurse Navigator Burley Saver, Interim Cardiac Imaging Nurse [place] [facility] Vascular Services Direct Office Dial: [phone]   For scheduling needs, including cancellations and rescheduling, please call [phone].

## 2019-10-15 NOTE — Progress Notes (Signed)
Cardiology Office Note   Date:  10/15/2019   ID:  [name], DOB 1960/10/09, MRN [id number]  PCP:  Asencion Gowda.August Saucer, MD  Cardiologist:   Kristeen Miss, MD   Chief Complaint  Patient presents with  . Chest Pain   Problem List 1. Chest pain    History of Present Illness: Gina Garrett is a 59 y.o. female who presents for chest pain  Across her chest  Left sided, above her breast , radiates down her arm At rest, not necessarily with exercise Last for a few minutes.  Occurs several times a week.  Not pleuritic.   Not associated with eating or drinking  Exercises / walks 3 times week for 30 minutes Able to to do this without chest pain  She has no past medical problems   December 08, 2015:  Still having some CP , Better with exercise  Pains are hit and miss,  Usually when sitting ..    May 27, 2021[name] seen today for follow-up visit.  As are 4 years ago for what sounds like atypical chest pain. Is sad today because her dog died last night .  She had a normal stress test in Aug. 2017.  Continues to have left sided chest pain Sometimes sharp, sometimes throbbing .  Pain is ramdom, not associated with eating, drinking, exercise Occasionally associated with change of position.  Pains might last a few min  LDL is mildly elevated.    Mother had MI at age 36.   Mother also had breast cancer       Past Medical History:  Diagnosis Date  . Chest pain     History reviewed. No pertinent surgical history.   Current Outpatient Medications  Medication Sig Dispense Refill  . Cholecalciferol (VITAMIN D3 PO) Take 2,000 mg by mouth.    . Coenzyme Q10 (CO Q 10 PO) Take 1 tablet by mouth daily.    . Gluc-Chonn-MSM-Boswellia-Vit D (GLUCOS CHONDROIT, BOSWELLIA,) TABS Take 1 capsule by mouth daily.    . Magnesium 200 MG TABS Take 1 tablet by mouth daily.    . Omega 3 1200 MG CAPS Take 1 capsule by mouth daily.    . Potassium 99 MG TABS Take 1 tablet by mouth daily.    .  Pyridoxine HCl (VITAMIN B-6 PO) Take 1 tablet by mouth daily.    . vitamin k 100 MCG tablet Take 100 mcg by mouth daily.    Marland Kitchen ZINC-VITAMIN C PO Take 100 mg by mouth daily.    . metoprolol tartrate (LOPRESSOR) 50 MG tablet Check pulse 2 hours before your CT. Take 1 pill (50 mg) is pulse is <65 beats/min. Take 2 pills (100 mg) if pulse is >66 beats/min. 2 tablet 0   No current facility-administered medications for this visit.    Allergies:   Patient has no known allergies.    Social History:  The patient  reports that she has never smoked. She has never used smokeless tobacco. She reports current alcohol use. She reports that she does not use drugs.   Family History:  The patient's family history includes Breast cancer in her mother; Diabetes in her mother; Heart attack (age of onset: 8) in her mother.    ROS:  Please see the history of present illness.     All other systems are reviewed and negative.    Physical Exam: Blood pressure 118/70, pulse 62, height 5\' 3"  (1.6 m), weight 146 lb 12 oz (66.6 kg),  SpO2 98 %.  GEN:  Well nourished, well developed in no acute distress HEENT: Normal NECK: No JVD; No carotid bruits LYMPHATICS: No lymphadenopathy CARDIAC: RRR  , no chest wall tenderness.  RESPIRATORY:  Clear to auscultation without rales, wheezing or rhonchi  ABDOMEN: Soft, non-tender, non-distended MUSCULOSKELETAL:  No edema; No deformity  SKIN: Warm and dry NEUROLOGIC:  Alert and oriented x 3   EKG: Oct 10, 2019: Normal sinus rhythm at 62.  No ST or T wave changes..  Recent Labs: No results found for requested labs within last 8760 hours.    Lipid Panel No results found for: CHOL, TRIG, HDL, CHOLHDL, VLDL, LDLCALC, LDLDIRECT    Wt Readings from Last 3 Encounters:  10/15/19 146 lb 12 oz (66.6 kg)  10/17/18 133 lb 9.6 oz (60.6 kg)  12/08/15 140 lb 12.8 oz (63.9 kg)      Other studies Reviewed: Additional studies/ records that were reviewed today include:  . Review of the above records demonstrates:   ASSESSMENT AND PLAN:  1.  Chest pain:    Has had persistent episodic chest pain.    She had a negative GXT in 2017.  She has a strong family history of premature coronary artery disease-her mother had an MI at age 60.  Cholesterol is mildly elevated.  I would like to do a coronary CT angiogram for further evaluation of this chest pain.  This will also give [place] a coronary calcium score and help with risk assessment.  She eats a fairly low-cholesterol diet.  She gets lots of exercise.  I will see her again in 1 year.  2.  Mild hyperlipidemia: Her LDL levels are mildly elevated.  Should continue to eat low-cholesterol diet.  If she has any evidence of coronary artery disease will need to start on a statin as her goal LDL would be 70 or less.  We will follow up with her in 1 year.     Current medicines are reviewed at length with the patient today.  The patient does not have concerns regarding medicines.  The following changes have been made:  no change  Labs/ tests ordered today include:   Orders Placed This Encounter  Procedures  . CT CORONARY MORPH W/CTA COR W/SCORE W/CA W/CM &/OR WO/CM  . CT CORONARY FRACTIONAL FLOW RESERVE DATA PREP  . CT CORONARY FRACTIONAL FLOW RESERVE FLUID ANALYSIS  . Basic Metabolic Panel (BMET)  . EKG 12-Lead     Disposition:   FU with  Me in 1 year .     Mertie Moores, MD  10/15/2019 11:16 AM    [facility] [place], [place], [place]  [address] Phone: [phone]; Fax: [phone]   [facility]  [address] [place], [place]  [address] [phone]   Fax [phone]

## 2019-11-17 ENCOUNTER — Other Ambulatory Visit: Payer: No Typology Code available for payment source

## 2019-11-17 ENCOUNTER — Other Ambulatory Visit: Payer: Self-pay

## 2019-11-17 DIAGNOSIS — R079 Chest pain, unspecified: Secondary | ICD-10-CM

## 2019-11-17 LAB — BASIC METABOLIC PANEL
BUN/Creatinine Ratio: 22 (ref 12–28)
BUN: 16 mg/dL (ref 8–27)
CO2: 26 mmol/L (ref 20–29)
Calcium: 9.3 mg/dL (ref 8.7–10.3)
Chloride: 104 mmol/L (ref 96–106)
Creatinine, Ser: 0.73 mg/dL (ref 0.57–1.00)
GFR calc Af Amer: 103 mL/min/{1.73_m2} (ref >59)
GFR calc non Af Amer: 89 mL/min/{1.73_m2} (ref >59)
Glucose: 70 mg/dL (ref 65–99)
Potassium: 4 mmol/L (ref 3.5–5.2)
Sodium: 141 mmol/L (ref 134–144)

## 2019-11-30 ENCOUNTER — Telehealth (HOSPITAL_COMMUNITY): Payer: Self-pay | Admitting: *Deleted

## 2019-11-30 NOTE — Telephone Encounter (Signed)
Reaching out to patient to offer assistance regarding upcoming cardiac imaging study; pt verbalizes understanding of appt date/time, parking situation and where to check in, pre-test NPO status and medications ordered, and verified current allergies; name and call back number provided for further questions should they arise ° °Omya Winfield Tai RN Navigator Cardiac Imaging °[facility] °[phone] office °[phone] cell ° °

## 2019-12-01 ENCOUNTER — Ambulatory Visit (HOSPITAL_COMMUNITY)
Admission: RE | Admit: 2019-12-01 | Discharge: 2019-12-01 | Disposition: A | Discharge status: Home / Self Care | Payer: No Typology Code available for payment source | Source: Ambulatory Visit | Attending: Cardiovascular Disease | Admitting: Cardiovascular Disease

## 2019-12-01 ENCOUNTER — Other Ambulatory Visit: Payer: Self-pay

## 2019-12-01 ENCOUNTER — Encounter: Payer: No Typology Code available for payment source | Admitting: *Deleted

## 2019-12-01 DIAGNOSIS — R0789 Other chest pain: Secondary | ICD-10-CM

## 2019-12-01 DIAGNOSIS — Z006 Encounter for examination for normal comparison and control in clinical research program: Secondary | ICD-10-CM

## 2019-12-01 MED ORDER — NITROGLYCERIN 0.4 MG SL SUBL
0.8000 mg | SUBLINGUAL_TABLET | Freq: Once | SUBLINGUAL | Status: AC
  Administered 2019-12-01: 0.8 mg via SUBLINGUAL

## 2019-12-01 MED ORDER — NITROGLYCERIN 0.4 MG SL SUBL
SUBLINGUAL_TABLET | SUBLINGUAL | Status: AC
  Filled 2019-12-01: qty 2

## 2019-12-01 MED ORDER — IOHEXOL 350 MG/ML SOLN
80.0000 mL | Freq: Once | INTRAVENOUS | Status: AC | PRN
  Administered 2019-12-01: 80 mL via INTRAVENOUS

## 2019-12-01 NOTE — Research (Signed)
Subject Name: Gina Garrett  Subject met inclusion and exclusion criteria.  The informed consent form, study requirements and expectations were reviewed with the subject and questions and concerns were addressed prior to the signing of the consent form.  The subject verbalized understanding of the trial requirements.  The subject agreed to participate in the [facility] 4 trial and signed the informed consent at 0734 on 12/01/19  The informed consent was obtained prior to performance of any protocol-specific procedures for the subject.  A copy of the signed informed consent was given to the subject and a copy was placed in the subject's medical record.   Timoteo Gaul

## 2020-01-12 ENCOUNTER — Other Ambulatory Visit (HOSPITAL_COMMUNITY)
Admission: RE | Admit: 2020-01-12 | Discharge: 2020-01-12 | Disposition: A | Discharge status: Home / Self Care | Payer: No Typology Code available for payment source | Source: Ambulatory Visit | Attending: Family Medicine | Admitting: Family Medicine

## 2020-01-12 ENCOUNTER — Other Ambulatory Visit: Payer: Self-pay | Admitting: Family Medicine

## 2020-01-12 DIAGNOSIS — Z124 Encounter for screening for malignant neoplasm of cervix: Secondary | ICD-10-CM | POA: Diagnosis not present

## 2020-01-14 LAB — CYTOLOGY - PAP
Comment: NEGATIVE
Diagnosis: NEGATIVE
High risk HPV: NEGATIVE

## 2021-11-26 ENCOUNTER — Encounter: Payer: Self-pay | Admitting: Cardiovascular Disease

## 2021-11-26 NOTE — Progress Notes (Unsigned)
Cardiology Office Note   Date:  11/26/2021   ID:  Gina Garrett, DOB 07-17-1960, MRN [id number]  PCP:  Lupita Raider, MD  Cardiologist:   Kristeen Miss, MD   Chief Complaint  Patient presents with   Chest Pain   Problem List 1. Chest pain    History of Present Illness: Gina Garrett is a 61 y.o. female who presents for chest pain  Across her chest  Left sided, above her breast , radiates down her arm At rest, not necessarily with exercise Last for a few minutes.  Occurs several times a week.  Not pleuritic.   Not associated with eating or drinking  Exercises / walks 3 times week for 30 minutes Able to to do this without chest pain  She has no past medical problems   December 08, 2015:  Still having some CP , Better with exercise  Pains are hit and miss,  Usually when sitting ..    May 27, 2021[name] seen today for follow-up visit.  As are 4 years ago for what sounds like atypical chest pain. Is sad today because her dog died last night .  She had a normal stress test in Aug. 2017.  Continues to have left sided chest pain Sometimes sharp, sometimes throbbing .  Pain is ramdom, not associated with eating, drinking, exercise Occasionally associated with change of position.  Pains might last a few min  LDL is mildly elevated.    Mother had MI at age 53.   Mother also had breast cancer   November 28, 2021 Gina Garrett is seen today for follow up of atypical CP      Past Medical History:  Diagnosis Date   Chest pain     History reviewed. No pertinent surgical history.   Current Outpatient Medications  Medication Sig Dispense Refill   Cholecalciferol (VITAMIN D3 PO) Take 2,000 mg by mouth.     Coenzyme Q10 (CO Q 10 PO) Take 1 tablet by mouth daily.     Gluc-Chonn-MSM-Boswellia-Vit D (GLUCOS CHONDROIT, BOSWELLIA,) TABS Take 1 capsule by mouth daily.     Magnesium 200 MG TABS Take 1 tablet by mouth daily.     metoprolol tartrate (LOPRESSOR) 50 MG tablet Check pulse  2 hours before your CT. Take 1 pill (50 mg) is pulse is <65 beats/min. Take 2 pills (100 mg) if pulse is >66 beats/min. 2 tablet 0   Omega 3 1200 MG CAPS Take 1 capsule by mouth daily.     Potassium 99 MG TABS Take 1 tablet by mouth daily.     Pyridoxine HCl (VITAMIN B-6 PO) Take 1 tablet by mouth daily.     vitamin k 100 MCG tablet Take 100 mcg by mouth daily.     ZINC-VITAMIN C PO Take 100 mg by mouth daily.     No current facility-administered medications for this visit.    Allergies:   Patient has no known allergies.    Social History:  The patient  reports that she has never smoked. She has never used smokeless tobacco. She reports current alcohol use. She reports that she does not use drugs.   Family History:  The patient's family history includes Breast cancer in her mother; Diabetes in her mother; Heart attack (age of onset: 24) in her mother.    ROS:  Please see the history of present illness.     All other systems are reviewed and negative.    Physical Exam: There were no  vitals taken for this visit.  GEN:  Well nourished, well developed in no acute distress HEENT: Normal NECK: No JVD; No carotid bruits LYMPHATICS: No lymphadenopathy CARDIAC: RRR ***, no murmurs, rubs, gallops RESPIRATORY:  Clear to auscultation without rales, wheezing or rhonchi  ABDOMEN: Soft, non-tender, non-distended MUSCULOSKELETAL:  No edema; No deformity  SKIN: Warm and dry NEUROLOGIC:  Alert and oriented x 3    EKG:   Recent Labs: No results found for requested labs within last 365 days.    Lipid Panel No results found for: "CHOL", "TRIG", "HDL", "CHOLHDL", "VLDL", "LDLCALC", "LDLDIRECT"    Wt Readings from Last 3 Encounters:  10/15/19 146 lb 12 oz (66.6 kg)  10/17/18 133 lb 9.6 oz (60.6 kg)  12/08/15 140 lb 12.8 oz (63.9 kg)      Other studies Reviewed: Additional studies/ records that were reviewed today include: . Review of the above records demonstrates:   ASSESSMENT  AND PLAN:  1.  Chest pain:      2.  Mild hyperlipidemia:   We will follow up with her in 1 year.     Current medicines are reviewed at length with the patient today.  The patient does not have concerns regarding medicines.  The following changes have been made:  no change  Labs/ tests ordered today include:   No orders of the defined types were placed in this encounter.    Disposition:       Kristeen Miss, MD  11/26/2021 8:11 PM    [facility] [address] [place], [place], [place]  [address] Phone: [phone]; Fax: [phone]   [facility]  [address] [place], [place]  [address] [phone]   Fax [phone]

## 2021-11-28 ENCOUNTER — Encounter: Payer: Self-pay | Admitting: Cardiovascular Disease

## 2021-11-28 ENCOUNTER — Ambulatory Visit: Payer: No Typology Code available for payment source | Admitting: Cardiovascular Disease

## 2021-11-28 VITALS — BP 136/70 | HR 60 | Ht 63.0 in | Wt 151.4 lb

## 2021-11-28 DIAGNOSIS — R0789 Other chest pain: Secondary | ICD-10-CM

## 2021-11-28 NOTE — Patient Instructions (Signed)
Medication Instructions:  Your physician recommends that you continue on your current medications as directed. Please refer to the Current Medication list given to you today.  *If you need a refill on your cardiac medications before your next appointment, please call your pharmacy*   Lab Work: NONE If you have labs (blood work) drawn today and your tests are completely normal, you will receive your results only by: MyChart Message (if you have MyChart) OR A paper copy in the mail If you have any lab test that is abnormal or we need to change your treatment, we will call you to review the results.   Testing/Procedures: NONE   Follow-Up: At [facility], you and your health needs are our priority.  As part of our continuing mission to provide you with exceptional heart care, we have created designated Provider Care Teams.  These Care Teams include your primary Cardiologist (physician) and Advanced Practice Providers (APPs -  Physician Assistants and Nurse Practitioners) who all work together to provide you with the care you need, when you need it.  Your next appointment:   As Needed  The format for your next appointment:   In Person  Provider:   Kristeen Miss, MD    Important Information About Sugar

## 2022-02-06 IMAGING — CT CT HEART MORP W/ CTA COR W/ SCORE W/ CA W/CM &/OR W/O CM
4 of 7 series · 8 of 20 positions shown, 9 images · IV contrast (APPLIED)
Comparison: None.
COMPARISON: None.

Addendum:
EXAM:
OVER-READ INTERPRETATION  CT CHEST

The following report is an over-read performed by radiologist Dr.
Jean Carry Cayemitte [REDACTED] on 12/01/2019. This
over-read does not include interpretation of cardiac or coronary
anatomy or pathology. The coronary calcium score/coronary CTA
interpretation by the cardiologist is attached.
CLINICAL DATA: 59 yo female with chest pain
Cardiac/Coronary  CT
TECHNIQUE: The patient was scanned on a [employer] Force scanner.

[Series 6: best diast 73 % · axial · 0.39mm/px · z∈[-152,-108]mm · 2 of 331 slices shown]
[im 111/331  vessel]
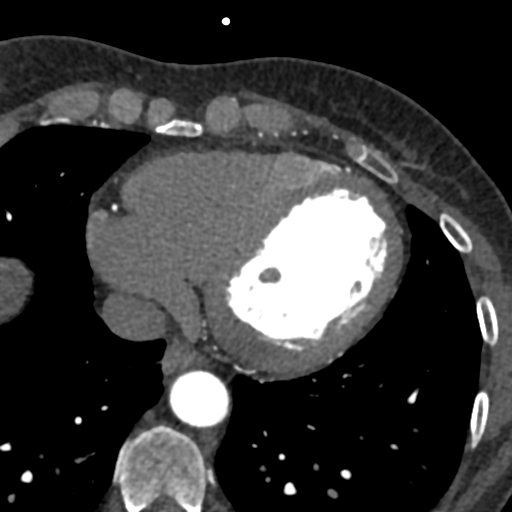
[im 221/331  vessel]
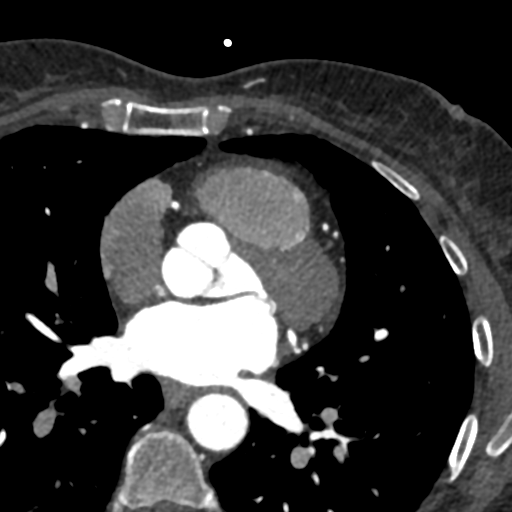

[Series 7: best syst · axial · 0.39mm/px · z∈[-152,-108]mm · 2 of 331 slices shown, 3 images]
[im 111/331  vessel]
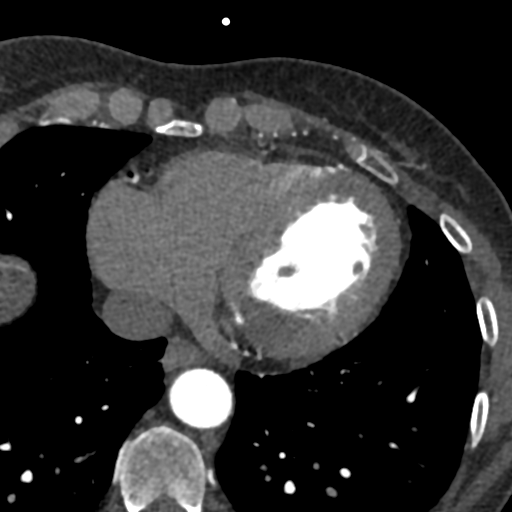
[im 111/331  lung]
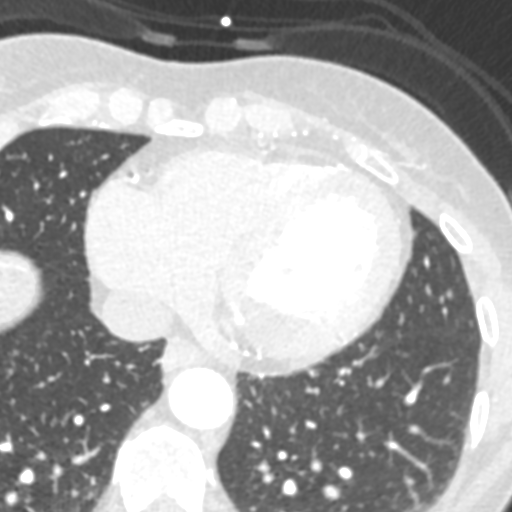
[im 221/331  vessel]
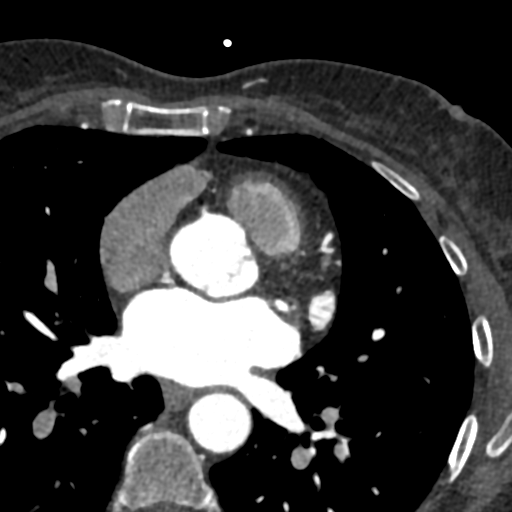

[Series 9: ts diast sharp 73 % · axial · 0.39mm/px · z∈[-152,-108]mm · 2 of 331 slices shown]
[im 111/331  lung]
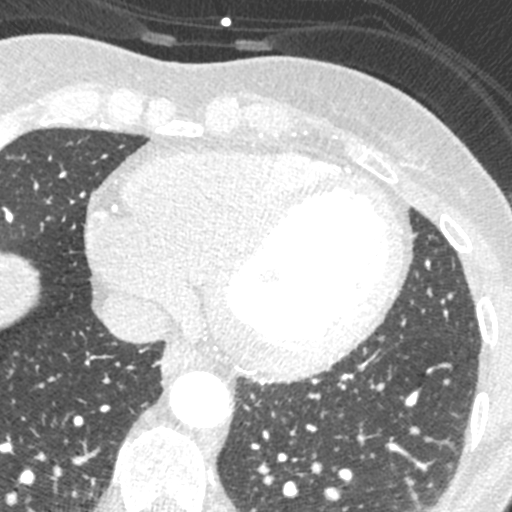
[im 221/331  lung]
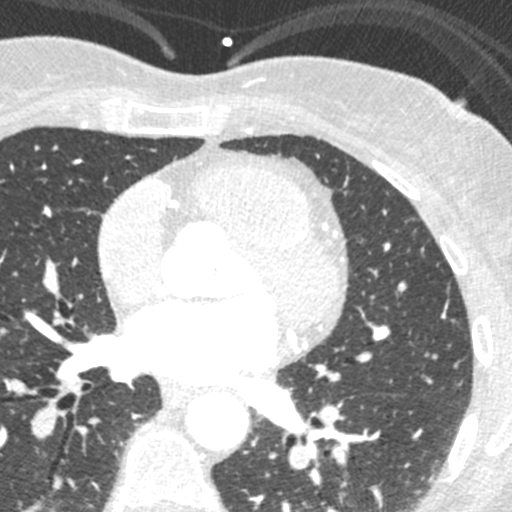

[Series 10: ts syst sharp · axial · 0.39mm/px · z∈[-152,-108]mm · 2 of 331 slices shown]
[im 111/331  lung]
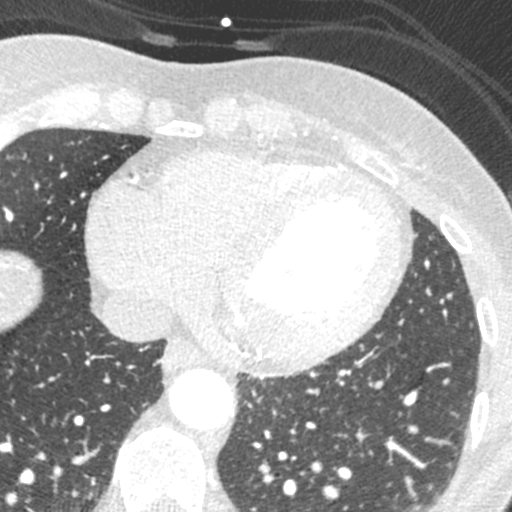
[im 221/331  lung]
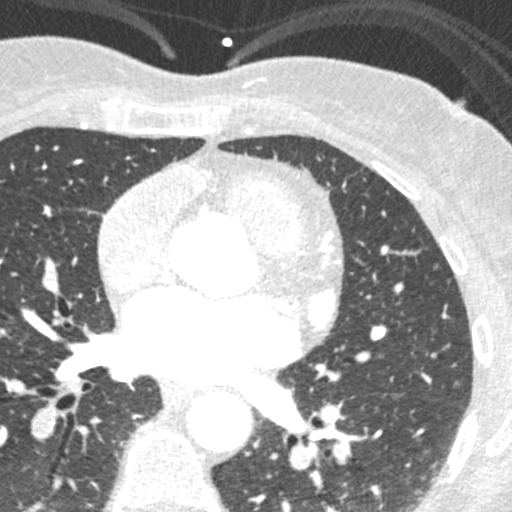

[8 of 20 positions shown; findings below may reference images not displayed]

FINDINGS: Within the visualized portions of the thorax there are no suspicious
appearing pulmonary nodules or masses, there is no acute
consolidative airspace disease, no pleural effusions, no
pneumothorax and no lymphadenopathy. Visualized portions of the
upper abdomen are unremarkable. There are no aggressive appearing
lytic or blastic lesions noted in the visualized portions of the
skeleton.
IMPRESSION: No significant incidental noncardiac findings are noted.
FINDINGS: A 120 kV prospective scan was triggered in the descending thoracic
aorta at 111 HU's. Axial non-contrast 3 mm slices were carried out
through the heart. The data set was analyzed on a dedicated work
station and scored using the Agatson method. Gantry rotation speed
was 250 msecs and collimation was .6 mm. No beta blockade and 0.8 mg
of sl NTG was given. The 3D data set was reconstructed in 5%
intervals of the 67-82 % of the R-R cycle. Diastolic phases were
analyzed on a dedicated work station using MPR, MIP and VRT modes.
The patient received 80 cc of contrast.

Aorta:  Normal size.  No calcifications.  No dissection.

Aortic Valve:  Trileaflet.  No calcifications.

Coronary Arteries:  Normal coronary origin.  Right dominance.

RCA is a dominant artery that gives rise to PDA. There is no plaque.

Left main is a large artery that gives rise to LAD and LCX arteries.

LAD is a large vessel that gives rise to tiny D1, small D2 and
medium D3; there is no plaque.

LCX is a non-dominant artery that gives rise to small OM1, medium
OM2 and OM3 and branching posterolateral. There is no plaque.

Other findings:

Normal pulmonary vein drainage into the left atrium.

Normal let atrial appendage without a thrombus.

Normal size of the pulmonary artery.
IMPRESSION: 1. Coronary calcium score of 0. This was 0 percentile for age and
sex matched control.

2. Normal coronary origin with right dominance.

3. No evidence of CAD; CAD-RADS 0.

Gume Badilla

*** End of Addendum ***
EXAM:
OVER-READ INTERPRETATION  CT CHEST

The following report is an over-read performed by radiologist Dr.
Jean Carry Cayemitte [REDACTED] on 12/01/2019. This
over-read does not include interpretation of cardiac or coronary
anatomy or pathology. The coronary calcium score/coronary CTA
interpretation by the cardiologist is attached.
FINDINGS: Within the visualized portions of the thorax there are no suspicious
appearing pulmonary nodules or masses, there is no acute
consolidative airspace disease, no pleural effusions, no
pneumothorax and no lymphadenopathy. Visualized portions of the
upper abdomen are unremarkable. There are no aggressive appearing
lytic or blastic lesions noted in the visualized portions of the
skeleton.
IMPRESSION: No significant incidental noncardiac findings are noted.
